# Patient Record
Sex: Female | Born: 1970 | Race: Black or African American | Hispanic: No | Marital: Single | Smoking: Current every day smoker
Health system: Southern US, Community
[De-identification: ages and names within clinical notes are randomized; demographics above are authoritative.]

## PROBLEM LIST (undated history)

## (undated) HISTORY — PX: OVARIAN CYST REMOVAL: SHX89

## (undated) HISTORY — PX: ABDOMINAL HYSTERECTOMY: SHX81

---

## 2014-01-20 ENCOUNTER — Encounter (HOSPITAL_BASED_OUTPATIENT_CLINIC_OR_DEPARTMENT_OTHER): Payer: Self-pay | Admitting: Emergency Medicine

## 2014-01-20 ENCOUNTER — Emergency Department (HOSPITAL_BASED_OUTPATIENT_CLINIC_OR_DEPARTMENT_OTHER)
Admission: EM | Admit: 2014-01-20 | Discharge: 2014-01-20 | Disposition: A | Discharge status: Home / Self Care | Payer: Self-pay | Attending: Emergency Medicine | Admitting: Emergency Medicine

## 2014-01-20 DIAGNOSIS — L723 Sebaceous cyst: Secondary | ICD-10-CM | POA: Insufficient documentation

## 2014-01-20 DIAGNOSIS — L729 Follicular cyst of the skin and subcutaneous tissue, unspecified: Secondary | ICD-10-CM

## 2014-01-20 DIAGNOSIS — M542 Cervicalgia: Secondary | ICD-10-CM | POA: Insufficient documentation

## 2014-01-20 DIAGNOSIS — F17200 Nicotine dependence, unspecified, uncomplicated: Secondary | ICD-10-CM | POA: Insufficient documentation

## 2014-01-20 MED ORDER — DOXYCYCLINE HYCLATE 100 MG PO TABS
100.0000 mg | ORAL_TABLET | Freq: Once | ORAL | Status: AC
Start: 2014-01-20 — End: 2014-01-20
  Administered 2014-01-20: 100 mg via ORAL
  Filled 2014-01-20: qty 1

## 2014-01-20 MED ORDER — DOXYCYCLINE HYCLATE 100 MG PO CAPS: 100.0000 mg | ORAL_CAPSULE | Freq: Two times a day (BID) | ORAL | Status: AC

## 2014-01-20 MED ORDER — TRAMADOL HCL 50 MG PO TABS
50.0000 mg | ORAL_TABLET | Freq: Once | ORAL | Status: AC
  Administered 2014-01-20: 50 mg via ORAL
  Filled 2014-01-20: qty 1

## 2014-01-20 MED ORDER — HYDROMORPHONE HCL 2 MG PO TABS: 2.0000 mg | ORAL_TABLET | ORAL | Status: AC | PRN

## 2014-01-20 MED ORDER — HYDROMORPHONE HCL 4 MG PO TABS
4.0000 mg | ORAL_TABLET | Freq: Once | ORAL | Status: DC
  Filled 2014-01-20: qty 1

## 2014-01-20 NOTE — ED Provider Notes (Signed)
CSN: [id number]     Arrival date & time 01/20/14  [id number] History  This chart was scribed for Shelda JakesScott W. Matheus Spiker, MD by Ronal Fearuke Okeke, ED Scribe. This patient was seen in room MH04/MH04 and the patient's care was started at 8:28 PM.    Chief Complaint  Patient presents with  . Headache   (Consider location/radiation/quality/duration/timing/severity/associated sxs/prior Treatment) Patient is a 43 y.o. female presenting with headaches. The history is provided by the patient. No language interpreter was used.  Headache Associated symptoms: neck pain   Associated symptoms: no abdominal pain, no back pain, no cough, no diarrhea, no fever, no nausea, no sore throat and no vomiting    HPI Comments: Kristin Morse is a 43 y.o. female who presents to the Emergency Department complaining of sudden onset swelling to the left side of her head and 8/10 neck pain, which is worse with moving her head, with pressure to left side of her head and associated head ache onset 2x days ago. Pt has taken Advil with some relief. She has had no prior occurences and denies drainage from the area, fever, nausea or vomiting.  No primary provider on file.  History reviewed. No pertinent past medical history. Past Surgical History  Procedure Laterality Date  . Abdominal hysterectomy    . Ovarian cyst removal     No family history on file. History  Substance Use Topics  . Smoking status: Current Every Day Smoker  . Smokeless tobacco: Not on file  . Alcohol Use: Yes   OB History   Grav Para Term Preterm Abortions TAB SAB Ect Mult Living                 Review of Systems  Constitutional: Negative for fever and chills.  HENT: Negative for rhinorrhea and sore throat.   Eyes: Negative for visual disturbance.  Respiratory: Negative for cough, shortness of breath and wheezing.   Cardiovascular: Negative for chest pain and leg swelling.  Gastrointestinal: Negative for nausea, vomiting, abdominal pain and diarrhea.   Genitourinary: Negative for dysuria.  Musculoskeletal: Positive for neck pain. Negative for back pain and joint swelling.  Skin: Negative for rash.  Neurological: Positive for headaches.  Hematological: Does not bruise/bleed easily.  All other systems reviewed and are negative.    Allergies  Percocet  Home Medications   Current Outpatient Rx  Name  Route  Sig  Dispense  Refill  . ibuprofen (ADVIL,MOTRIN) 200 MG tablet   Oral   Take 200 mg by mouth every 6 (six) hours as needed.         . doxycycline (VIBRAMYCIN) 100 MG capsule   Oral   Take 1 capsule (100 mg total) by mouth 2 (two) times daily.   14 capsule   0   . HYDROmorphone (DILAUDID) 2 MG tablet   Oral   Take 1 tablet (2 mg total) by mouth every 4 (four) hours as needed for severe pain.   20 tablet   0    BP 138/70  Pulse 68  Temp(Src) 99.5 F (37.5 C) (Oral)  Resp 18  Ht 5\' 4"  (1.626 m)  Wt 170 lb (77.111 kg)  BMI 29.17 kg/m2  SpO2 100% Physical Exam  Nursing note and vitals reviewed. Constitutional: She is oriented to person, place, and time. She appears well-developed and well-nourished. No distress.  HENT:  Head: Normocephalic and atraumatic.  Mouth/Throat: Oropharynx is clear and moist. No oropharyngeal exudate.  Eyes: EOM are normal. No scleral icterus.  Neck:  Neck supple. No tracheal deviation present.  Cardiovascular: Normal rate and regular rhythm.   Pulmonary/Chest: Effort normal and breath sounds normal. No respiratory distress.  Abdominal: Soft. Bowel sounds are normal. There is no tenderness.  Musculoskeletal: Normal range of motion. She exhibits no edema.  Lymphadenopathy:    She has no cervical adenopathy.  Neurological: She is alert and oriented to person, place, and time. No cranial nerve deficit. She exhibits normal muscle tone. Coordination normal.  Skin: Skin is warm and dry.  Area of redness 1cm; area of swelling about 3 cm; induration 3cm; no fluctuance; does not look like  dermatitis   Psychiatric: She has a normal mood and affect. Her behavior is normal.    ED Course  Procedures (including critical care time)  DIAGNOSTIC STUDIES: Oxygen Saturation is 100% on RA, normal by my interpretation.    COORDINATION OF CARE:  8:35 PM- Pt advised of plan for treatment including antibiotic and pain medication and pt agrees.   Labs Review Labs Reviewed - No data to display Imaging Review No results found.  EKG Interpretation   None       MDM   1. Scalp cyst    Patient with a skin cyst to the left side of her scalp. Not fluctuant not needing I&D. We'll treat with doxycycline and pain medicine. Precautions provided.  I personally performed the services described in this documentation, which was scribed in my presence. The recorded information has been reviewed and is accurate.     Shelda Jakes, MD 01/20/14 2124

## 2014-01-20 NOTE — ED Notes (Signed)
C/o noting a swollen area to the left side of her head two days ago.  C/o headache, pain going down the left side of her neck.  No prior history of this.

## 2014-01-20 NOTE — Discharge Instructions (Signed)
Take antibiotic as directed for the next 7 days. Take pain medicine as needed. If this helps this starts to drain some pus that's okay if things get a lot worse return. Expected to improve over the next couple days and then not be much better by day 7.  Resource guide provided below to help you find a local Dr.   Emergency Department Resource Guide 1) Find a Doctor and Pay Out of Pocket Although you won't have to find out who is covered by your insurance plan, it is a good idea to ask around and get recommendations. You will then need to call the office and see if the doctor you have chosen will accept you as a new patient and what types of options they offer for patients who are self-pay. Some doctors offer discounts or will set up payment plans for their patients who do not have insurance, but you will need to ask so you aren't surprised when you get to your appointment.  2) Contact Your Local Health Department Not all health departments have doctors that can see patients for sick visits, but many do, so it is worth a call to see if yours does. If you don't know where your local health department is, you can check in your phone book. The CDC also has a tool to help you locate your state's health department, and many state websites also have listings of all of their local health departments.  3) Find a Walk-in Clinic If your illness is not likely to be very severe or complicated, you may want to try a walk in clinic. These are popping up all over the country in pharmacies, drugstores, and shopping centers. They're usually staffed by nurse practitioners or physician assistants that have been trained to treat common illnesses and complaints. They're usually fairly quick and inexpensive. However, if you have serious medical issues or chronic medical problems, these are probably not your best option.  No Primary Care Doctor: - Call Health Connect at  [phone] - they can help you locate a primary care  doctor that  accepts your insurance, provides certain services, etc. - Physician Referral Service- [phone]  Chronic Pain Problems: Organization         Address  Phone   Notes  [facility]  [phone]) [phone] Patients need to be referred by their primary care doctor.   Medication Assistance: Organization         Address  Phone   Notes  [facility] Medication [facility] [address] [place], [place] [address] [phone](336) [phone] --Must be a resident of [place] -- Must have NO insurance coverage whatsoever (no Medicaid/ Medicare, etc.) -- The pt. MUST have a primary care doctor that directs their care regularly and follows them in the community   MedAssist  [phone]   [employer]  [phone]    Agencies that provide inexpensive medical care: Organization         Address  Phone   Notes  [facility]  [phone]   [facility]    [phone]   [facility] [address] [place], [place] [address] [phone]) [phone]   [facility] [address], [place] [phone]) [phone]   [facility]    [phone]   [facility]    [phone](336) [phone]   [facility]  [address], [place] Phone:  (  [phone], Fax:  [phone] Hours of Operation:  9 am - 6 pm, M-F.  Also accepts Medicaid/Medicare and self-pay.  [facility] for [place], [address], [place] Phone: [phone], Fax: [phone]. Hours of Operation:  8:30 am - 5:30 pm, M-F.  Also accepts Medicaid and self-pay.  [facility] [place] [address] [place] Phone: [phone]   [place], [place], [place] [phone], Ext. [phone] Mondays & Thursdays: 7-9 AM.  First 15 patients are seen on a first come, first serve basis.    [place]  Providers:  Organization         Address  Phone   Notes  [facility] [address], [place] [phone] Also accepts self-pay patients.  [facility] [address], [place]  [phone]   [place] [address], [address], [place] [phone]   [facility] [address], [place] [phone]   Lucianne Lei [address], [place]   [phone] Only accepts [place] Access [place] patients after they have their name applied to their card.   Self-Pay (no insurance) in [facility]:  Organization         Address  Phone   Notes  Sickle Cell Patients, [facility] [place] [phone]   [facility] [place] [phone]   [facility] [place]  [place], [place], [place] [phone]   [facility]/Dr. Osei-Bonsu  [address] [place] or [address], [place] [phone] Phone number for both [place] and [place] locations is the same.  [facility] [address], [place] [phone]   [facility] [address] [place] or [address] [phone]   [facility] [address] [place] [phone], phone; [phone], fax Sees patients 1st and 3rd Saturday of every month.  Must not qualify for public or private insurance (i.e. Medicaid, Medicare, [place] Health Choice, Veterans' Benefits)  Household income should be no more than 200% of the poverty level The clinic cannot treat you if you are pregnant or think you are pregnant  Sexually transmitted diseases are not treated at the clinic.    Dental Care: Organization         Address  Phone  Notes  [facility] [phone] Accepts children up to age 2 who are enrolled in [place] or [place]; pregnant women with a Medicaid card; and children who have applied for Medicaid or [place] Health Choice, but were declined, whose parents can pay a reduced fee at time of service.  [facility] of [facility]  [address], [place] [phone] Accepts children up to age 72 who are enrolled in [place] or [place]; pregnant women with a Medicaid card; and children who have applied for Medicaid or [place] Health Choice, but were declined, whose parents can pay a reduced fee at time of service.  [facility]  [place] [phone] Patients are seen by appointment only. Walk-ins  are not accepted. [place] will see patients 39 years of age and older. Monday - Tuesday (8am-5pm) Most Wednesdays (8:30-5pm) $30 per visit, cash only  [facility] Adult Dental Access PROGRAM  [address], [facility] [phone] Patients are seen by appointment only. Walk-ins are not accepted. [place] [facility] will see patients 56 years of age and older. One Wednesday Evening (Monthly: Volunteer Based).  $30 per visit, cash only  [place]  [phone] for adults; Children under age 3, call Graduate Pediatric Dentistry at [phone]. Children aged 85-14, please call [phone] to request a pediatric application.  Dental services are provided in all areas of dental care including fillings, crowns and bridges, complete and partial dentures, implants, gum treatment, root canals, and extractions. Preventive care is also provided. Treatment is provided to both adults and children. Patients are selected via a lottery and there is often a waiting list.   [facility] [address], [place]  [phone] [email or web address]   [facility]  [address] [place], [place] [phone], Ext. [phone] Second and Fourth Thursday of each month, opens at 6:30 AM; Clinic ends at 9 AM.  Patients are seen on a first-come first-served basis, and a limited number are seen during each clinic.   [facility]  [address] Hillard Danker [place], [place] [phone]   Eligibility Requirements You must have lived in [place], [place], or [place] counties for at least the last three months.   You cannot be eligible for state or federal sponsored [employer], including [employer], [place], or [employer].   You generally cannot be eligible for healthcare insurance through your employer.    How to apply: Eligibility screenings are held every Tuesday and Wednesday afternoon from 1:00 pm until 4:00 pm. You do not need an appointment for the interview!  [facility] [address] [place], [place]   [place]  [place] Department  [place]  [phone]    Behavioral Health Resources in the Community: Intensive Outpatient Programs Organization         Address  Phone  Notes  [place]. [address] [place], [place] [phone]   [facility] Outpatient [address] [place], [place]   [facility]: [facility] [address], [place], [place]   [place] [address],  [place], [place] or [phone]   Substance Abuse Resources Organization         Address  Phone  Notes  [facility] Drug [facility]  [phone]   [place]  [phone]   [facility]   Chinita Pester  [phone]   Residential & Outpatient Substance Abuse Program  [phone]   Psychological Services Organization         Address  Phone  Notes  [facility]  [place]  [phone]   [place] [address] [place] [phone] or [phone]    Mobile Crisis Teams Organization         Address  Phone  Notes  [facility], Mobile Crisis Care Unit  [phone]   [facility]  [address] [place], [place]   [facility] [address], [place] [phone]  Self-Help/Support Groups °Organization         Address  Phone             Notes  °[facility] [place] - variety of support groups  [phone]- [phone] Call for more information  °Narcotics Anonymous (NA), [facility] [address], °[place]  2 meetings at this location  ° °Residential Treatment Programs °Organization         Address  Phone  Notes  °[facility] [address],    °[place]  [phone]   °[facility] ° [address], [place], [place] [phone]   °[facility] [address], [place] [phone] Admissions: 8am-3pm M-F  °[facility] [address]    °[place], [place] [phone]   °[facility] [address], [place], [place] [phone]   °[facility] [address]  °[place], [place] [phone]   °[facility] - Intensive Outpatient [address], [place], [place] [phone]   °[facility] [address]  °[place], [place] 1-877-615-2722 or [phone]   °[facility] ([facility]) [address] [place], [place] [phone] Accepts Medicaid  °[facility] [address]  °[place] 1-800-659-3381 Substance Abuse/Addiction Treatment  ° °[place] Behavioral Health Resources °Organization         Address  Phone  Notes  °[facility]  (888) [phone]   °Julie Brannon, PhD [address] [place], [place]   (336) [phone] or (336) [phone]    °[facility]   [address] °[place], [place] (336) 349-4454   °[facility] [address], [place], [place] ([phone]) [phone] Insurance/Medicaid/sponsorship through [facility]  °[facility] [address]                                    [place], [place] [phone]) [phone] Therapy/tele-psych/case  °[facility] [address]  ° [place], [place] (336) [phone]    °Dr. Arfeen  (336) [phone]   °[facility] [place]  [employer] [place] [facility] 1) [address], [place] °2) [address], [place] °3)  [address], [place] (336) [phone] °(336) [phone] ° °(336) [phone]   °[place] Child Abuse Hotline (336) 342-1394 or (336) [phone] (After Hours)    ° ° ° °

## 2016-12-07 ENCOUNTER — Emergency Department (HOSPITAL_COMMUNITY)
Admission: EM | Admit: 2016-12-07 | Discharge: 2016-12-07 | Disposition: A | Discharge status: Home / Self Care | Payer: Self-pay | Attending: Emergency Medicine | Admitting: Emergency Medicine

## 2016-12-07 ENCOUNTER — Emergency Department (HOSPITAL_COMMUNITY): Payer: Self-pay

## 2016-12-07 ENCOUNTER — Encounter (HOSPITAL_COMMUNITY): Payer: Self-pay | Admitting: Emergency Medicine

## 2016-12-07 DIAGNOSIS — W1830XA Fall on same level, unspecified, initial encounter: Secondary | ICD-10-CM | POA: Insufficient documentation

## 2016-12-07 DIAGNOSIS — F17200 Nicotine dependence, unspecified, uncomplicated: Secondary | ICD-10-CM | POA: Insufficient documentation

## 2016-12-07 DIAGNOSIS — S52044A Nondisplaced fracture of coronoid process of right ulna, initial encounter for closed fracture: Secondary | ICD-10-CM | POA: Insufficient documentation

## 2016-12-07 DIAGNOSIS — Z79899 Other long term (current) drug therapy: Secondary | ICD-10-CM | POA: Insufficient documentation

## 2016-12-07 DIAGNOSIS — Y999 Unspecified external cause status: Secondary | ICD-10-CM | POA: Insufficient documentation

## 2016-12-07 DIAGNOSIS — Y939 Activity, unspecified: Secondary | ICD-10-CM | POA: Insufficient documentation

## 2016-12-07 DIAGNOSIS — Y929 Unspecified place or not applicable: Secondary | ICD-10-CM | POA: Insufficient documentation

## 2016-12-07 MED ORDER — TRAMADOL HCL 50 MG PO TABS: 50.0000 mg | ORAL_TABLET | Freq: Four times a day (QID) | ORAL | 0 refills | Status: AC | PRN

## 2016-12-07 NOTE — ED Triage Notes (Signed)
Pt reports fall last night , right elbow injury. Limited ROM. No obvious deformity.

## 2016-12-07 NOTE — ED Notes (Signed)
Informed ortho of patients orders.

## 2016-12-07 NOTE — ED Provider Notes (Signed)
[facility]-EMER[facility]ENCY DEPT Provider Note   CSN: [id number] Arrival date & time: 12/07/16  1609   By signing my name below, I, Avnee Patel, attest that this documentation has been prepared under the direction and in the presence of  Arthor CaptainAbigail Yoltzin Barg, PA-C. Electronically Signed: [place] PuAvnee Patel, ED Scribe. 12/07/16. 6:52 PM.   History   Chief Complaint Chief Complaint  Patient presents with  . Fall    elbow injury   The history is provided by the patient. No language interpreter was used.   HPI Comments:  Kristin Morse is a 45 y.o. female who presents to the Emergency Department complaining of sudden onset, moderate right elbow pain s/p a fall which occurred yesterday. Her pain is worse with certain movements. No alleviating factors noted. Pt denies numbness, tingling, any other associated symptoms and modifying factors at this time.   History reviewed. No pertinent past medical history.  There are no active problems to display for this patient.   Past Surgical History:  Procedure Laterality Date  . ABDOMINAL HYSTERECTOMY    . OVARIAN CYST REMOVAL      OB History    No data available       Home Medications    Prior to Admission medications   Medication Sig Start Date End Date Taking? Authorizing Provider  doxycycline (VIBRAMYCIN) 100 MG capsule Take 1 capsule (100 mg total) by mouth 2 (two) times daily. 01/20/14   Vanetta MuldersScott Zackowski, MD  HYDROmorphone (DILAUDID) 2 MG tablet Take 1 tablet (2 mg total) by mouth every 4 (four) hours as needed for severe pain. 01/20/14   Vanetta MuldersScott Zackowski, MD  ibuprofen (ADVIL,MOTRIN) 200 MG tablet Take 200 mg by mouth every 6 (six) hours as needed.    Historical Provider, MD  traMADol (ULTRAM) 50 MG tablet Take 1 tablet (50 mg total) by mouth every 6 (six) hours as needed. 12/07/16   Arthor CaptainAbigail Laquandra Carrillo, PA-C    Family History No family history on file.  Social History Social History  Substance Use Topics  . Smoking status: Current Every Day Smoker  .  Smokeless tobacco: Never Used  . Alcohol use Yes     Allergies   Percocet [oxycodone-acetaminophen]   Review of Systems Review of Systems  Musculoskeletal: Positive for arthralgias, joint swelling and myalgias.  Neurological: Negative for weakness and numbness.   Physical Exam Updated Vital Signs BP 148/95   Pulse 96   Temp 98.1 F (36.7 C) (Oral)   Resp 18   SpO2 100%   Physical Exam  Constitutional: She is oriented to person, place, and time. She appears well-developed and well-nourished. No distress.  HENT:  Head: Normocephalic and atraumatic.  Eyes: Conjunctivae are normal.  Cardiovascular: Normal rate.   Pulmonary/Chest: Effort normal.  Abdominal: She exhibits no distension.  Musculoskeletal: She exhibits edema and tenderness.  Pt is holding elbow in flexion. Pain with flexion, extension, supination and pronation. Swelling noted.   Neurological: She is alert and oriented to person, place, and time.  Skin: Skin is warm and dry.  Psychiatric: She has a normal mood and affect.  Nursing note and vitals reviewed.   ED Treatments / Results  DIAGNOSTIC STUDIES:  Oxygen Saturation is 100% on RA, normal by my interpretation.    COORDINATION OF CARE:  6:48 PM Discussed treatment plan with pt at bedside and pt agreed to plan.  Labs (all labs ordered are listed, but only abnormal results are displayed) Labs Reviewed - No data to display  EKG  EKG Interpretation None  Radiology Dg Elbow Complete Right  Result Date: 12/07/2016 CLINICAL DATA:  Fall, pain to the right elbow EXAM: RIGHT ELBOW - COMPLETE 3+ VIEW COMPARISON:  None. FINDINGS: Fat pad distention, consistent with small elbow effusion. No dislocation. Minimal irregularity of the coronoid process of the proximal ulna. IMPRESSION: 1. Elbow effusion. 2. Mild irregularity of the coronoid process of the ulna, appearance is suggestive of bony spurring with fracture felt less likely however recommend  radiographic follow-up, given the presence of elbow effusion. Electronically Signed   By: Jasmine PangKim  Fujinaga M.D.   On: 12/07/2016 17:12    Procedures Procedures (including critical care time)  Medications Ordered in ED Medications - No data to display   Initial Impression / Assessment and Plan / ED Course  I have reviewed the triage vital signs and the nursing notes.  Pertinent labs & imaging results that were available during my care of the patient were reviewed by me and considered in my medical decision making (see chart for details).  Clinical Course     Patient X-Ray positive forfracture. Pt advised to follow up with hand specialist. Patient given post splint and sling while in ED, conservative therapy recommended and discussed. Patient will be discharged home & is agreeable with above plan. F/u with Dr.ortmann. Returns precautions discussed. Pt appears safe for discharge.  Final Clinical Impressions(s) / ED Diagnoses   Final diagnoses:  Closed nondisplaced fracture of coronoid process of right ulna, initial encounter    New Prescriptions New Prescriptions   TRAMADOL (ULTRAM) 50 MG TABLET    Take 1 tablet (50 mg total) by mouth every 6 (six) hours as needed.  I personally performed the services described in this documentation, which was scribed in my presence. The recorded information has been reviewed and is accurate.        Arthor Captainbigail Charise Leinbach, PA-C 12/07/16 1926    Bethann BerkshireJoseph Zammit, MD 12/08/16 [id number]

## 2016-12-07 NOTE — Discharge Instructions (Signed)
You do not have as radial head fracture, however, the paperwork attached will give you some good home instruction and care guidelines. Please make an appointment for follow up with the had specialist as soon as possible for work related precautions and long term treatment.  Get help right away if:  You have severe pain when you stretch your fingers.  You have fluid or a bad smell coming from your splint.  Your hand or fingers get cold or turn pale or blue.  You lose feeling in any part of your hand or arm

## 2019-11-07 ENCOUNTER — Other Ambulatory Visit: Payer: Self-pay | Admitting: Nurse Practitioner

## 2019-11-07 DIAGNOSIS — Z1231 Encounter for screening mammogram for malignant neoplasm of breast: Secondary | ICD-10-CM

## 2019-11-27 ENCOUNTER — Other Ambulatory Visit: Payer: Self-pay

## 2019-11-27 DIAGNOSIS — Z20822 Contact with and (suspected) exposure to covid-19: Secondary | ICD-10-CM

## 2019-11-28 LAB — NOVEL CORONAVIRUS, NAA: SARS-CoV-2, NAA: NOT DETECTED

## 2019-11-29 ENCOUNTER — Telehealth: Payer: Self-pay | Admitting: *Deleted

## 2019-11-29 NOTE — Telephone Encounter (Signed)
Patient called and was given negative covid results . 

## 2020-01-02 ENCOUNTER — Other Ambulatory Visit: Payer: Self-pay

## 2020-01-02 ENCOUNTER — Ambulatory Visit
Admission: RE | Admit: 2020-01-02 | Discharge: 2020-01-02 | Disposition: A | Discharge status: Home / Self Care | Payer: PRIVATE HEALTH INSURANCE | Source: Ambulatory Visit | Attending: Nurse Practitioner | Admitting: Nurse Practitioner

## 2020-01-02 DIAGNOSIS — Z1231 Encounter for screening mammogram for malignant neoplasm of breast: Secondary | ICD-10-CM

## 2020-01-03 ENCOUNTER — Other Ambulatory Visit: Payer: Self-pay | Admitting: Nurse Practitioner

## 2020-01-03 DIAGNOSIS — R928 Other abnormal and inconclusive findings on diagnostic imaging of breast: Secondary | ICD-10-CM

## 2020-01-08 ENCOUNTER — Ambulatory Visit
Admission: RE | Admit: 2020-01-08 | Discharge: 2020-01-08 | Disposition: A | Discharge status: Home / Self Care | Payer: Commercial Managed Care - PPO | Source: Ambulatory Visit | Attending: Nurse Practitioner | Admitting: Nurse Practitioner

## 2020-01-08 ENCOUNTER — Other Ambulatory Visit: Payer: Self-pay | Admitting: Nurse Practitioner

## 2020-01-08 ENCOUNTER — Other Ambulatory Visit: Payer: Self-pay

## 2020-01-08 DIAGNOSIS — R921 Mammographic calcification found on diagnostic imaging of breast: Secondary | ICD-10-CM

## 2020-01-08 DIAGNOSIS — R928 Other abnormal and inconclusive findings on diagnostic imaging of breast: Secondary | ICD-10-CM

## 2020-01-22 ENCOUNTER — Ambulatory Visit
Admission: RE | Admit: 2020-01-22 | Discharge: 2020-01-22 | Disposition: A | Discharge status: Home / Self Care | Payer: Commercial Managed Care - PPO | Source: Ambulatory Visit | Attending: Nurse Practitioner | Admitting: Nurse Practitioner

## 2020-01-22 ENCOUNTER — Other Ambulatory Visit: Payer: Self-pay

## 2020-01-22 DIAGNOSIS — R921 Mammographic calcification found on diagnostic imaging of breast: Secondary | ICD-10-CM

## 2020-02-07 ENCOUNTER — Other Ambulatory Visit: Payer: Self-pay | Admitting: Family Medicine

## 2020-02-07 DIAGNOSIS — Z20822 Contact with and (suspected) exposure to covid-19: Secondary | ICD-10-CM

## 2020-02-12 ENCOUNTER — Ambulatory Visit: Payer: Commercial Managed Care - PPO | Attending: Internal Medicine

## 2020-02-12 DIAGNOSIS — Z20822 Contact with and (suspected) exposure to covid-19: Secondary | ICD-10-CM

## 2020-02-13 LAB — NOVEL CORONAVIRUS, NAA: SARS-CoV-2, NAA: NOT DETECTED

## 2020-11-11 IMAGING — MG DIGITAL DIAGNOSTIC UNILAT LEFT W/ CAD
1 series · 1 of 1 positions shown · non-contrast
Comparison: Previous exam(s).

CLINICAL DATA: Screening recall from baseline for left breast
calcifications.

EXAM:
DIGITAL DIAGNOSTIC LEFT MAMMOGRAM WITH CAD

[L CC]
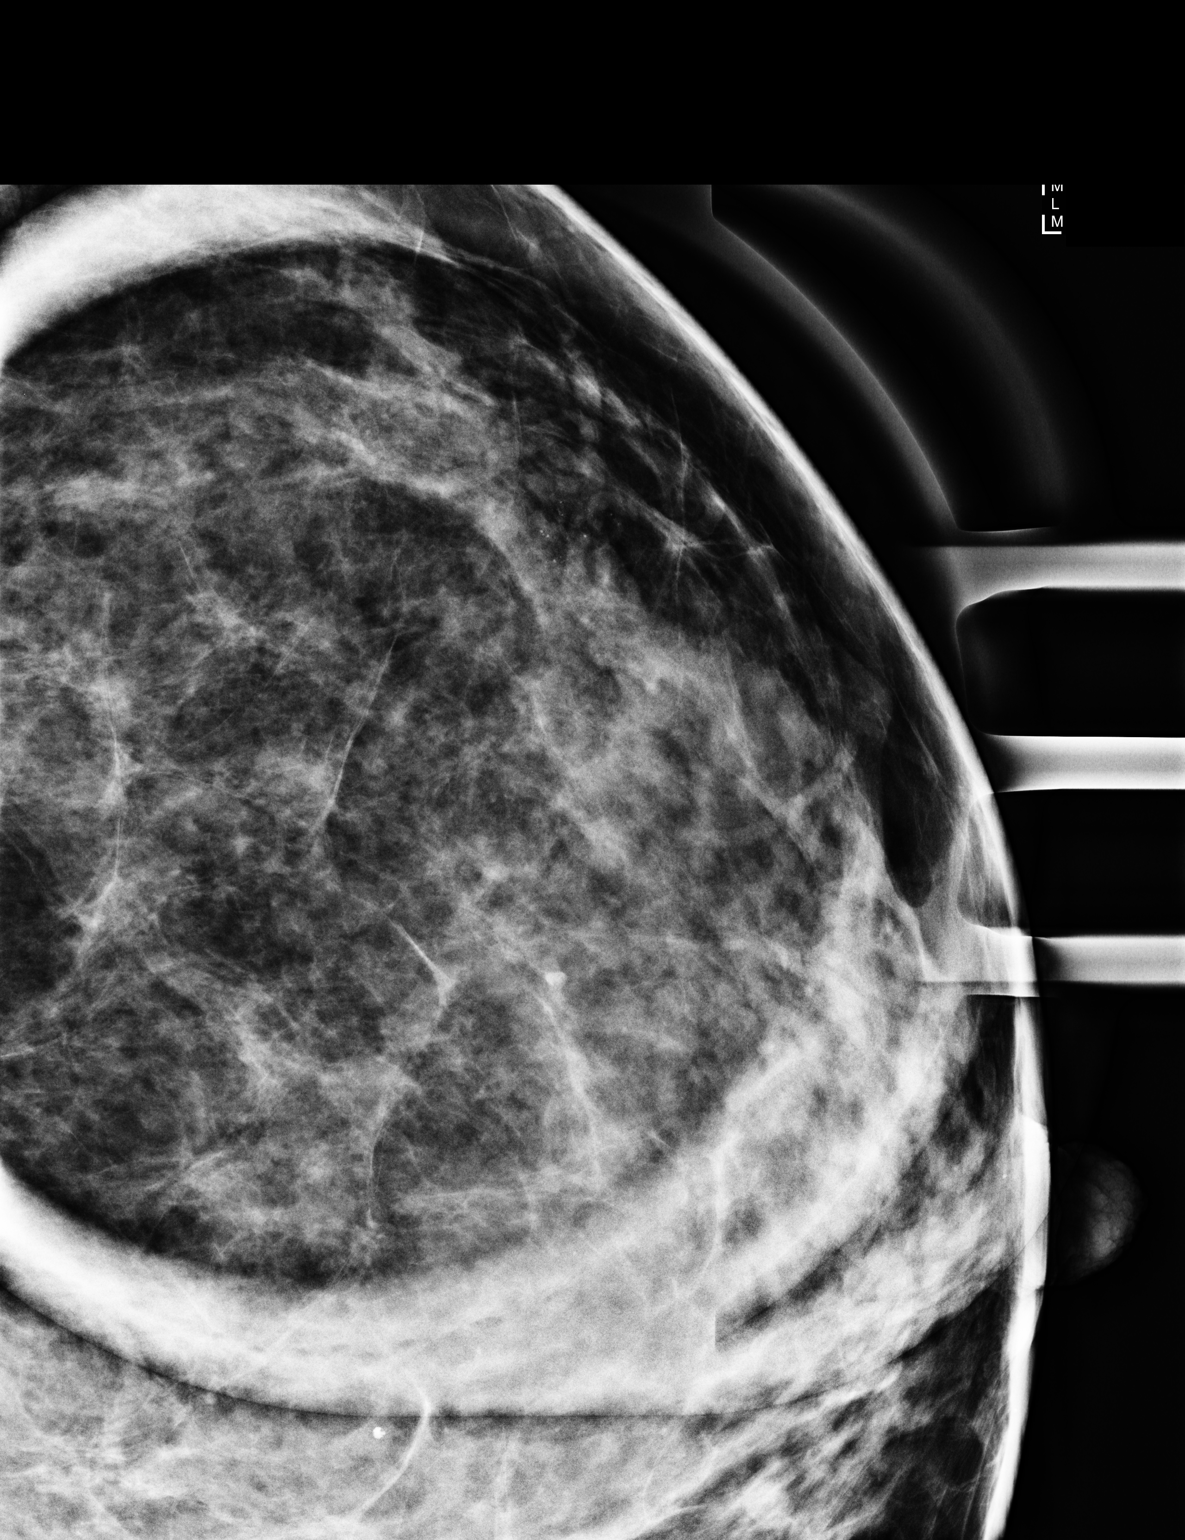

[1 of 1 positions shown; findings below may reference images not displayed]

ACR Breast Density Category c: The breast tissue is heterogeneously
dense, which may obscure small masses.
FINDINGS: In the upper outer anterior left breast there is a 9 mm group of
punctate and amorphous calcifications.

Mammographic images were processed with CAD.
IMPRESSION: There is an indeterminate group of calcifications in the upper-outer
left breast.

RECOMMENDATION:
Stereotactic biopsy is recommended for the left breast
calcifications, and has been scheduled for 01/22/2020 at [DATE] a.m.

I have discussed the findings and recommendations with the patient.
If applicable, a reminder letter will be sent to the patient
regarding the next appointment.

BI-RADS CATEGORY  4: Suspicious.

## 2020-11-25 IMAGING — MG MM BREAST BX W LOC DEV 1ST LESION IMAGE BX SPEC STEREO GUIDE*L*
8 series · 8 of 28 positions shown · non-contrast
Comparison: Previous exams.
COMPARISON: Previous exams.

Addendum:
CLINICAL DATA: Biopsy of left breast calcifications

EXAM:
LEFT BREAST STEREOTACTIC CORE NEEDLE BIOPSY

[L (1 of 3)]
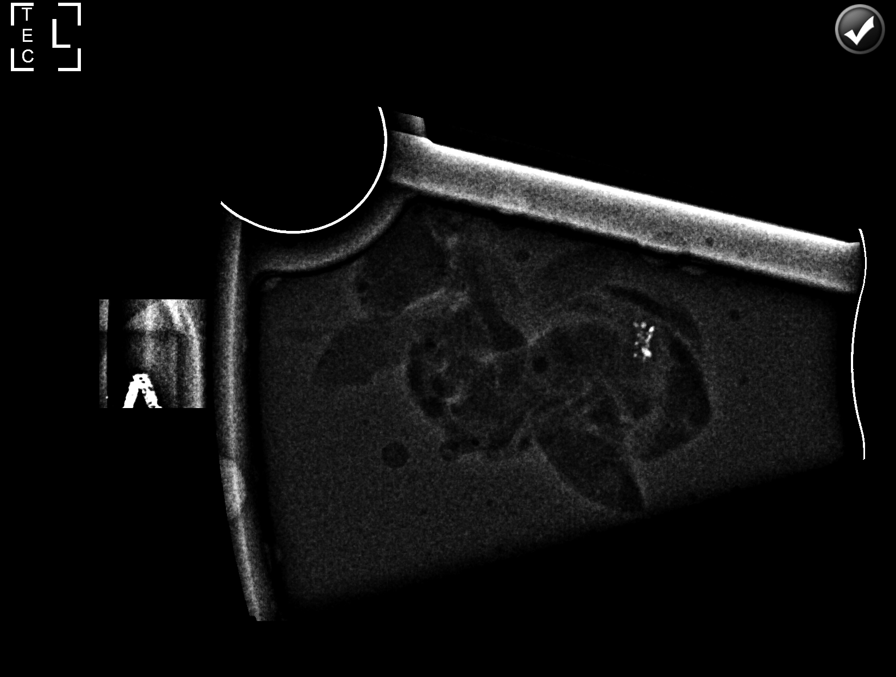

[L (2 of 3)]
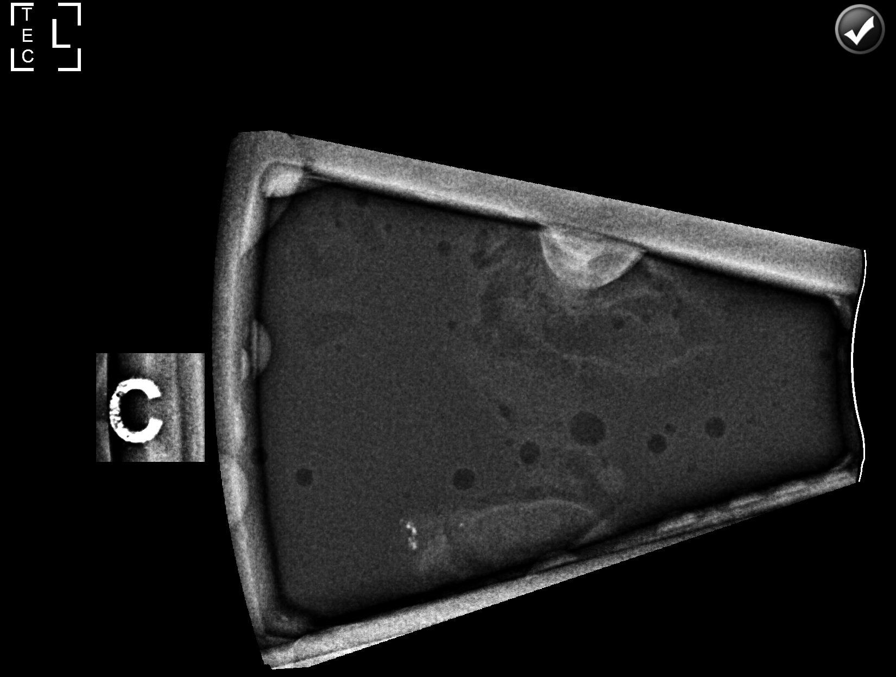

[L (3 of 3)]
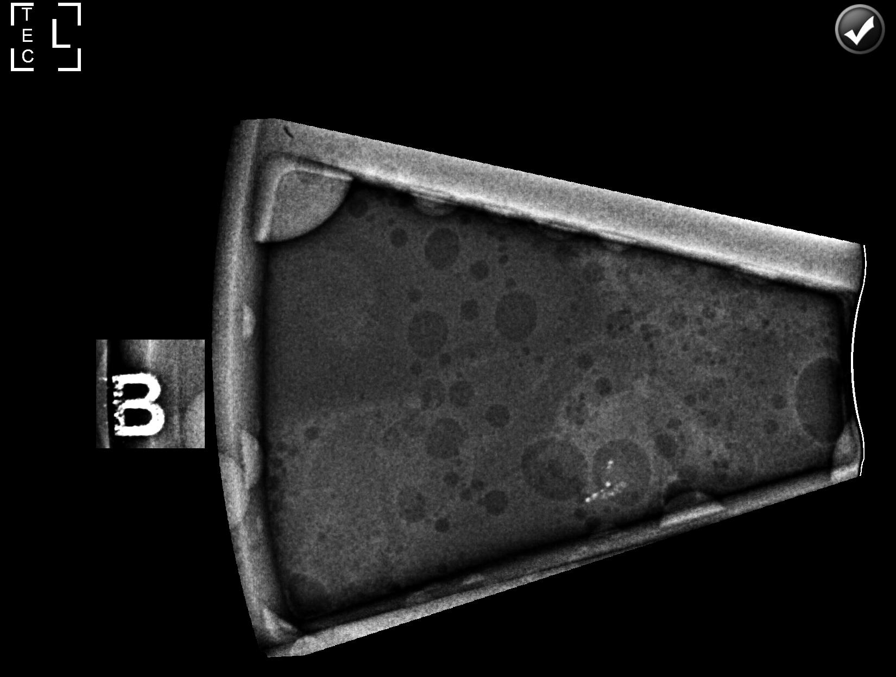

[L CC tomo (1 of 5) · tomo slice 30/59.0]
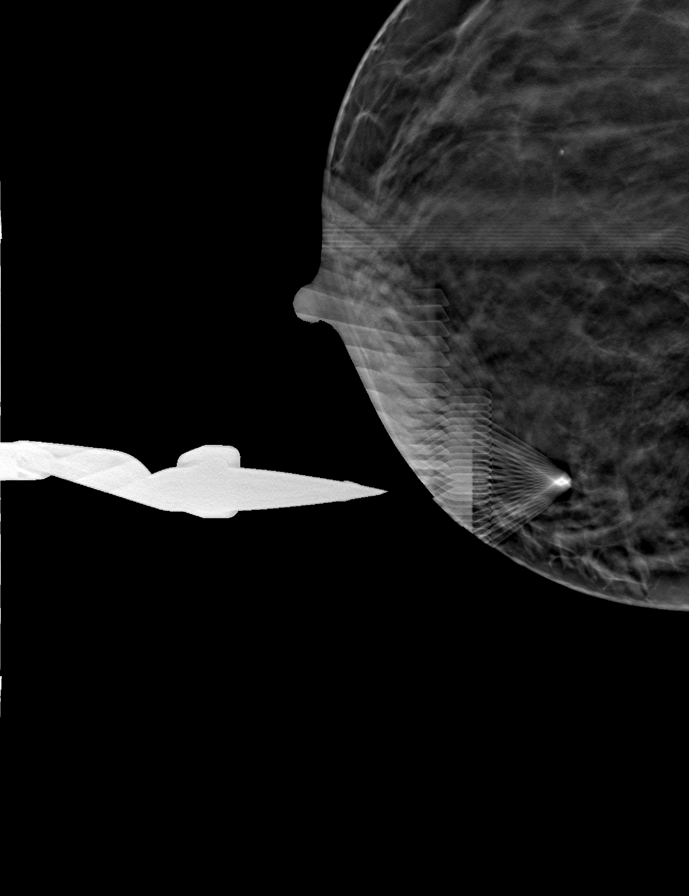

[L CC tomo (2 of 5) · tomo slice 30/59.0]
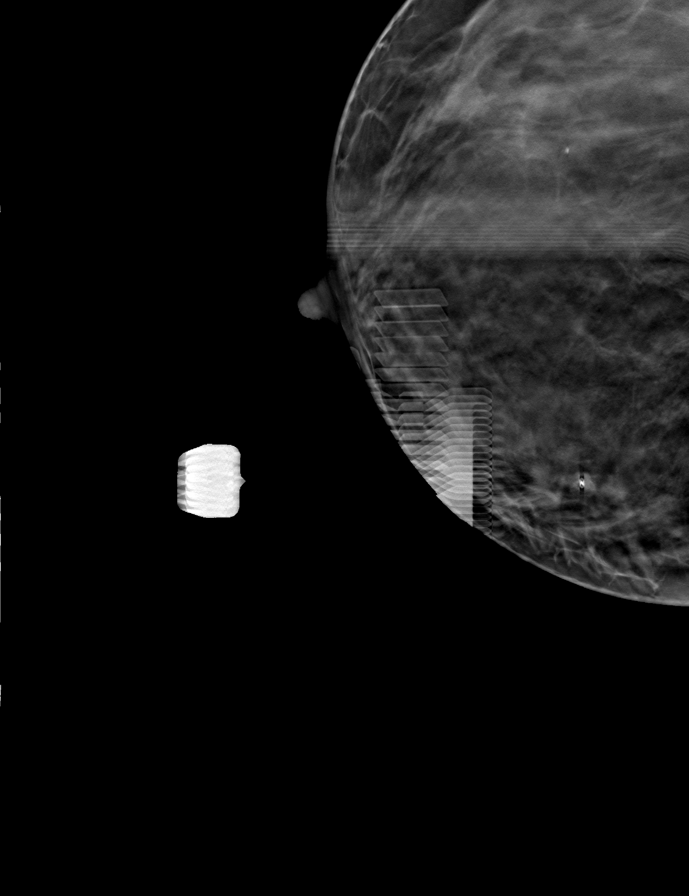

[L CC tomo (3 of 5) · tomo slice 27/53.0]
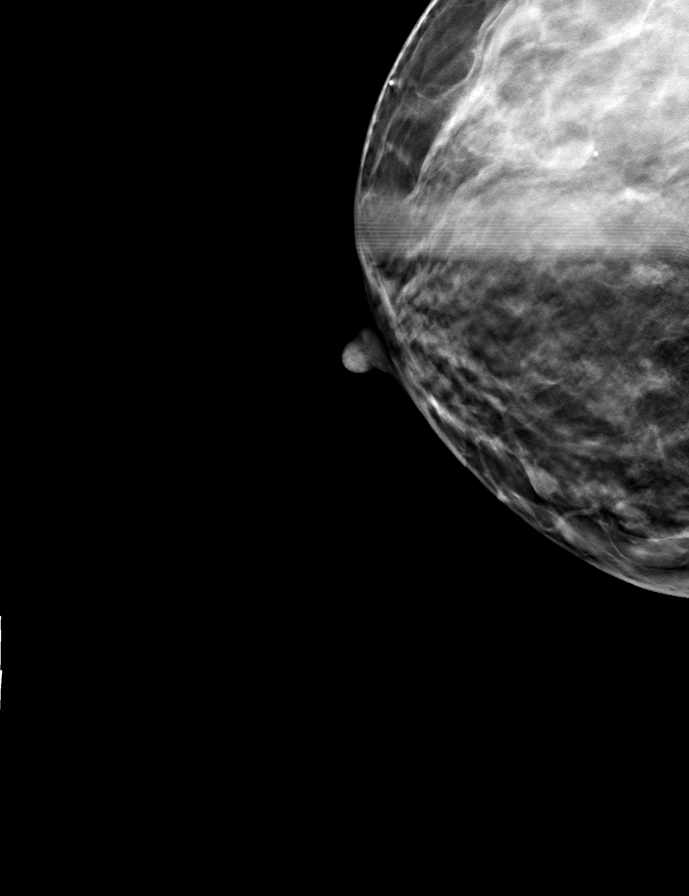

[L CC tomo (4 of 5) · tomo slice 30/59.0]
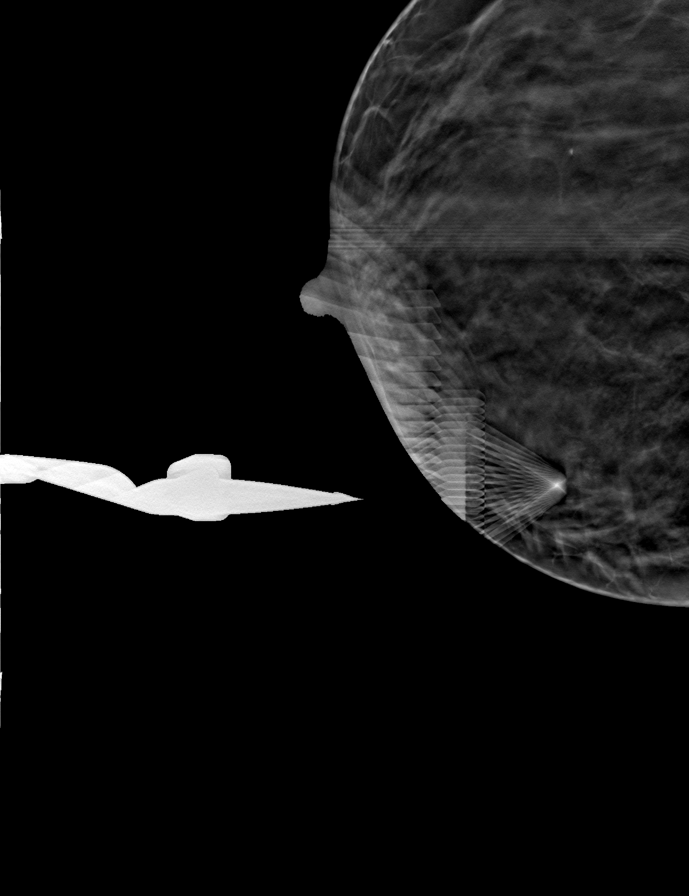

[L CC tomo (5 of 5) · tomo slice 30/59.0]
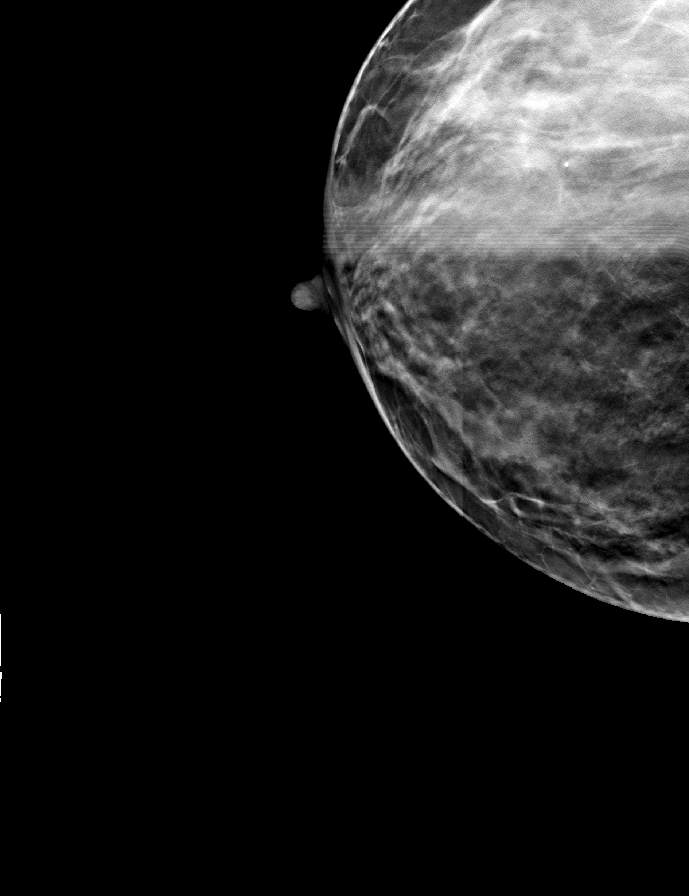

[8 of 28 positions shown; findings below may reference images not displayed]



Using sterile technique and 1% Lidocaine as local anesthetic, under
stereotactic guidance, a 9 gauge vacuum assisted device was used to
perform core needle biopsy of calcifications in the upper-outer left
breast using a superior approach. Specimen radiograph was performed
showing calcifications in [DATE] specimen. Specimens with
calcifications are identified for pathology.

Lesion quadrant: Upper-outer

At the conclusion of the procedure, coil shaped tissue marker clip
was deployed into the biopsy cavity. Follow-up 2-view mammogram was
performed and dictated separately.
IMPRESSION: Stereotactic-guided biopsy of left breast calcifications. No
apparent complications.

ADDENDUM:
Pathology revealed FIBROCYSTIC CHANGES WITH CALCIFICATIONS of the
LEFT breast, upper outer. This was found to be concordant by Dr.
Euka Yotagri.

Pathology results were discussed with the patient by telephone. The
patient reported doing well after the biopsy with tenderness at the
site. Post biopsy instructions and care were reviewed and questions
were answered. The patient was encouraged to call The [REDACTED]

The patient was instructed to return for annual screening
mammography and informed a reminder notice would be sent regarding
this appointment.

Pathology results reported by Yoel Tiger RN on 01/23/2020.



Using sterile technique and 1% Lidocaine as local anesthetic, under
stereotactic guidance, a 9 gauge vacuum assisted device was used to
perform core needle biopsy of calcifications in the upper-outer left
breast using a superior approach. Specimen radiograph was performed
showing calcifications in [DATE] specimen. Specimens with
calcifications are identified for pathology.

Lesion quadrant: Upper-outer

At the conclusion of the procedure, coil shaped tissue marker clip
was deployed into the biopsy cavity. Follow-up 2-view mammogram was
performed and dictated separately.
IMPRESSION: Stereotactic-guided biopsy of left breast calcifications. No
apparent complications.

## 2021-10-07 ENCOUNTER — Other Ambulatory Visit: Payer: Self-pay | Admitting: Nurse Practitioner

## 2021-10-07 DIAGNOSIS — Z1231 Encounter for screening mammogram for malignant neoplasm of breast: Secondary | ICD-10-CM

## 2021-11-19 ENCOUNTER — Ambulatory Visit: Payer: Commercial Managed Care - PPO

## 2021-12-26 ENCOUNTER — Ambulatory Visit: Payer: Self-pay

## 2022-01-01 ENCOUNTER — Ambulatory Visit
Admission: RE | Admit: 2022-01-01 | Discharge: 2022-01-01 | Disposition: A | Discharge status: Home / Self Care | Payer: Commercial Managed Care - PPO | Source: Ambulatory Visit | Attending: Nurse Practitioner | Admitting: Nurse Practitioner

## 2022-01-01 DIAGNOSIS — Z1231 Encounter for screening mammogram for malignant neoplasm of breast: Secondary | ICD-10-CM

## 2022-01-29 ENCOUNTER — Ambulatory Visit: Payer: Commercial Managed Care - PPO

## 2022-02-19 ENCOUNTER — Ambulatory Visit: Payer: Commercial Managed Care - PPO

## 2022-03-04 ENCOUNTER — Ambulatory Visit
Admission: RE | Admit: 2022-03-04 | Discharge: 2022-03-04 | Disposition: A | Discharge status: Home / Self Care | Payer: Commercial Managed Care - PPO | Source: Ambulatory Visit | Attending: Nurse Practitioner | Admitting: Nurse Practitioner

## 2024-01-25 ENCOUNTER — Other Ambulatory Visit: Payer: Self-pay | Admitting: Nurse Practitioner

## 2024-01-25 DIAGNOSIS — Z1231 Encounter for screening mammogram for malignant neoplasm of breast: Secondary | ICD-10-CM

## 2024-01-31 ENCOUNTER — Ambulatory Visit: Payer: Commercial Managed Care - PPO

## 2024-01-31 ENCOUNTER — Ambulatory Visit
Admission: RE | Admit: 2024-01-31 | Discharge: 2024-01-31 | Disposition: A | Discharge status: Home / Self Care | Payer: Commercial Managed Care - PPO | Source: Ambulatory Visit | Attending: Nurse Practitioner | Admitting: Nurse Practitioner

## 2024-01-31 DIAGNOSIS — Z1231 Encounter for screening mammogram for malignant neoplasm of breast: Secondary | ICD-10-CM

## 2024-07-20 ENCOUNTER — Other Ambulatory Visit: Payer: Self-pay | Admitting: Nurse Practitioner

## 2024-07-20 DIAGNOSIS — Z1231 Encounter for screening mammogram for malignant neoplasm of breast: Secondary | ICD-10-CM

## 2025-01-31 ENCOUNTER — Ambulatory Visit
Admission: RE | Admit: 2025-01-31 | Discharge: 2025-01-31 | Disposition: A | Source: Ambulatory Visit | Attending: Nurse Practitioner | Admitting: Nurse Practitioner

## 2025-01-31 DIAGNOSIS — Z1231 Encounter for screening mammogram for malignant neoplasm of breast: Secondary | ICD-10-CM
